# Patient Record
Sex: Male | Born: 1955 | Race: Black or African American | Hispanic: No | Marital: Single | Smoking: Former smoker
Health system: Southern US, Community
[De-identification: ages and names within clinical notes are randomized; demographics above are authoritative.]

## PROBLEM LIST (undated history)

## (undated) DIAGNOSIS — I10 Essential (primary) hypertension: Secondary | ICD-10-CM

## (undated) DIAGNOSIS — G40909 Epilepsy, unspecified, not intractable, without status epilepticus: Secondary | ICD-10-CM

## (undated) DIAGNOSIS — C61 Malignant neoplasm of prostate: Secondary | ICD-10-CM

## (undated) DIAGNOSIS — M25569 Pain in unspecified knee: Secondary | ICD-10-CM

## (undated) HISTORY — DX: Pain in unspecified knee: M25.569

## (undated) HISTORY — DX: Epilepsy, unspecified, not intractable, without status epilepticus: G40.909

---

## 2017-08-09 ENCOUNTER — Emergency Department: Payer: BC Managed Care – PPO

## 2017-08-09 ENCOUNTER — Emergency Department
Admission: EM | Admit: 2017-08-09 | Discharge: 2017-08-09 | Disposition: A | Discharge status: Home / Self Care | Payer: BC Managed Care – PPO | Attending: Emergency Medicine | Admitting: Emergency Medicine

## 2017-08-09 ENCOUNTER — Encounter: Payer: Self-pay | Admitting: Intensive Care

## 2017-08-09 DIAGNOSIS — Z87891 Personal history of nicotine dependence: Secondary | ICD-10-CM | POA: Diagnosis not present

## 2017-08-09 DIAGNOSIS — I10 Essential (primary) hypertension: Secondary | ICD-10-CM | POA: Insufficient documentation

## 2017-08-09 DIAGNOSIS — Z8546 Personal history of malignant neoplasm of prostate: Secondary | ICD-10-CM | POA: Insufficient documentation

## 2017-08-09 DIAGNOSIS — R0789 Other chest pain: Secondary | ICD-10-CM | POA: Diagnosis present

## 2017-08-09 DIAGNOSIS — R079 Chest pain, unspecified: Secondary | ICD-10-CM

## 2017-08-09 HISTORY — DX: Essential (primary) hypertension: I10

## 2017-08-09 HISTORY — DX: Epilepsy, unspecified, not intractable, without status epilepticus: G40.909

## 2017-08-09 HISTORY — DX: Malignant neoplasm of prostate: C61

## 2017-08-09 LAB — BASIC METABOLIC PANEL
Anion gap: 8 (ref 5–15)
BUN: 11 mg/dL (ref 6–20)
CO2: 24 mmol/L (ref 22–32)
Calcium: 8.7 mg/dL — ABNORMAL LOW (ref 8.9–10.3)
Chloride: 104 mmol/L (ref 101–111)
Creatinine, Ser: 0.84 mg/dL (ref 0.61–1.24)
GFR calc Af Amer: >60 mL/min (ref >60)
GFR calc non Af Amer: >60 mL/min (ref >60)
Glucose, Bld: 136 mg/dL — ABNORMAL HIGH (ref 65–99)
Potassium: 3.4 mmol/L — ABNORMAL LOW (ref 3.5–5.1)
Sodium: 136 mmol/L (ref 135–145)

## 2017-08-09 LAB — CBC
HCT: 41.5 % (ref 40.0–52.0)
Hemoglobin: 13.9 g/dL (ref 13.0–18.0)
MCH: 30.2 pg (ref 26.0–34.0)
MCHC: 33.5 g/dL (ref 32.0–36.0)
MCV: 90.1 fL (ref 80.0–100.0)
Platelets: 171 10*3/uL (ref 150–440)
RBC: 4.6 MIL/uL (ref 4.40–5.90)
RDW: 12.8 % (ref 11.5–14.5)
WBC: 5.8 10*3/uL (ref 3.8–10.6)

## 2017-08-09 LAB — TROPONIN I
Troponin I: <0.03 ng/mL (ref <0.03)
Troponin I: <0.03 ng/mL (ref <0.03)

## 2017-08-09 MED ORDER — IOPAMIDOL (ISOVUE-370) INJECTION 76%
75.0000 mL | Freq: Once | INTRAVENOUS | Status: AC | PRN
  Administered 2017-08-09: 75 mL via INTRAVENOUS
  Filled 2017-08-09: qty 75

## 2017-08-09 NOTE — Discharge Instructions (Addendum)
Fortunately today your blood work, your chest x-ray, your EKG, and your CT scan were reassuring.  Please make an appointment to follow-up with the cardiologist in 2 days for reevaluation return to the emergency department sooner for any concerns whatsoever.  It was a pleasure to take care of you today, and thank you for coming to our emergency department.  If you have any questions or concerns before leaving please ask the nurse to grab me and I'm more than happy to go through your aftercare instructions again.  If you were prescribed any opioid pain medication today such as Norco, Vicodin, Percocet, morphine, hydrocodone, or oxycodone please make sure you do not drive when you are taking this medication as it can alter your ability to drive safely.  If you have any concerns once you are home that you are not improving or are in fact getting worse before you can make it to your follow-up appointment, please do not hesitate to call 911 and come back for further evaluation.  Darel Hong, MD  Results for orders placed or performed during the hospital encounter of 95/28/41  Basic metabolic panel  Result Value Ref Range   Sodium 136 135 - 145 mmol/L   Potassium 3.4 (L) 3.5 - 5.1 mmol/L   Chloride 104 101 - 111 mmol/L   CO2 24 22 - 32 mmol/L   Glucose, Bld 136 (H) 65 - 99 mg/dL   BUN 11 6 - 20 mg/dL   Creatinine, Ser 0.84 0.61 - 1.24 mg/dL   Calcium 8.7 (L) 8.9 - 10.3 mg/dL   GFR calc non Af Amer >60 >60 mL/min   GFR calc Af Amer >60 >60 mL/min   Anion gap 8 5 - 15  CBC  Result Value Ref Range   WBC 5.8 3.8 - 10.6 K/uL   RBC 4.60 4.40 - 5.90 MIL/uL   Hemoglobin 13.9 13.0 - 18.0 g/dL   HCT 41.5 40.0 - 52.0 %   MCV 90.1 80.0 - 100.0 fL   MCH 30.2 26.0 - 34.0 pg   MCHC 33.5 32.0 - 36.0 g/dL   RDW 12.8 11.5 - 14.5 %   Platelets 171 150 - 440 K/uL  Troponin I  Result Value Ref Range   Troponin I <0.03 <0.03 ng/mL  Troponin I  Result Value Ref Range   Troponin I <0.03 <0.03 ng/mL    Dg Chest 2 View  Result Date: 08/09/2017 CLINICAL DATA:  Chest pain for 1 week EXAM: CHEST  2 VIEW COMPARISON:  None. FINDINGS: Cardiac shadow is within normal limits. The lungs are clear bilaterally. A E rounded density is noted over the anterior aspect of the right second rib not well appreciated on the lateral projection and may be related to small bone island. Rib series may be helpful for further evaluation. No other focal abnormality is noted. IMPRESSION: Questionable nodular density over the anterior aspect of the right second rib. Rib series on the right may be helpful for further evaluation. Electronically Signed   By: Inez Catalina M.D.   On: 08/09/2017 12:56   Ct Angio Chest Pe W/cm &/or Wo Cm  Result Date: 08/09/2017 CLINICAL DATA:  Chest pain over the last week. Swelling of the lower extremities. EXAM: CT ANGIOGRAPHY CHEST WITH CONTRAST TECHNIQUE: Multidetector CT imaging of the chest was performed using the standard protocol during bolus administration of intravenous contrast. Multiplanar CT image reconstructions and MIPs were obtained to evaluate the vascular anatomy. CONTRAST:  84mL ISOVUE-370 IOPAMIDOL (ISOVUE-370) INJECTION 76%  COMPARISON:  Chest radiography same day. FINDINGS: Cardiovascular: Pulmonary arterial opacification is excellent. There are no pulmonary emboli. Mild aortic atherosclerosis. No definite coronary artery calcification. Heart size is normal. No pericardial fluid. Mediastinum/Nodes: No mass or lymphadenopathy. 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 Negative Hounsfield unit density also consistent with chronic mucous. No other pulmonary parenchymal finding. No evidence of emphysema. No infiltrate or collapse. No effusions. Upper Abdomen: No acute finding. 4 mm stone midportion of the right kidney. Musculoskeletal:  Old compression fracture of T6. No acute bone finding. Review of the MIP images confirms the above findings. IMPRESSION: No pulmonary emboli or other acute chest pathology. 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 Is there any study anywhere else for comparison? If not, I would suggest a follow-up study in 6 months to show stability. Very low concern for malignancy given this constellation of findings. Electronically Signed   By: Nelson Chimes M.D.   On: 08/09/2017 16:40

## 2017-08-09 NOTE — ED Notes (Signed)
Pt returned from CT °

## 2017-08-09 NOTE — ED Provider Notes (Signed)
[facility] Emergency Department Provider Note  ____________________________________________   First MD Initiated Contact with Patient 08/09/17 1543     (approximate)  I have reviewed the triage vital signs and the nursing notes.   HISTORY  Chief Complaint Chest Pain   HPI Shawn Carr is a 62 y.o. male who comes to the emergency department with 1 week of intermittent atypical chest pain.  Pain is sharp nonexertional diffuse across his upper chest.  Nonradiating.  Not ripping or tearing and does not go straight to his back.  He is also concerned because he is felt like his right lower extremity has been swollen for the past week or so.  He has no history of DVT or pulmonary embolism.  He has no history of coronary artery disease.  He has had no recent illness.  His pain is currently controlled.  It is non-positional when it occurs.  Nothing in particular seems to make the symptoms better or worse.  Past Medical History:  Diagnosis Date  . Epilepsy ([place])   . Hypertension   . Prostate cancer ([place])     There are no active problems to display for this patient.   History reviewed. No pertinent surgical history.  Prior to Admission medications   Not on File    Allergies Morphine and related  History reviewed. No pertinent family history.  Social History Social History   Tobacco Use  . Smoking status: Former Smoker    Types: Cigarettes    Last attempt to quit: 08/06/2014    Years since quitting: 3.0  . Smokeless tobacco: Never Used  Substance Use Topics  . Alcohol use: No    Frequency: Never  . Drug use: No    Review of Systems Constitutional: No fever/chills Eyes: No visual changes. ENT: No sore throat. Cardiovascular: Positive for chest pain. Respiratory: Denies shortness of breath. Gastrointestinal: No abdominal pain.  No nausea, no vomiting.  No diarrhea.  No constipation. Genitourinary: Negative for dysuria. Musculoskeletal:  Negative for back pain. Skin: Negative for rash. Neurological: Negative for headaches, focal weakness or numbness.   ____________________________________________   PHYSICAL EXAM:  VITAL SIGNS: ED Triage Vitals [08/09/17 1217]  Enc Vitals Group     BP (!) 155/82     Pulse Rate 89     Resp 14     Temp 99.5 F (37.5 C)     Temp Source Oral     SpO2 98 %     Weight 211 lb (95.7 kg)     Height 5\' 9"  (1.753 m)     Head Circumference      Peak Flow      Pain Score 1     Pain Loc      Pain Edu?      Excl. in [place]?     Constitutional: Alert and oriented x4 well-appearing nontoxic no diaphoresis speaks in full clear sentences Eyes: PERRL EOMI. Head: Atraumatic. Nose: No congestion/rhinnorhea. Mouth/Throat: No trismus Neck: No stridor.  Able to lie completely flat no JVD Cardiovascular: Normal rate, regular rhythm. Grossly normal heart sounds.  Good peripheral circulation. Respiratory: Normal respiratory effort.  No retractions. Lungs CTAB and moving good air Gastrointestinal: Soft nontender Musculoskeletal: 1+ pitting edema to bilateral lower extremities to mid shin.  Legs are equal in size Neurologic:  Normal speech and language. No gross focal neurologic deficits are appreciated. Skin:  Skin is warm, dry and intact. No rash noted. Psychiatric: Mood and affect are normal. Speech  and behavior are normal.    ____________________________________________   DIFFERENTIAL includes but not limited to  Acute coronary syndrome, pulmonary embolism, aortic dissection, musculoskeletal pain, influenza ____________________________________________   LABS (all labs ordered are listed, but only abnormal results are displayed)  Labs Reviewed  BASIC METABOLIC PANEL - Abnormal; Notable for the following components:      Result Value   Potassium 3.4 (*)    Glucose, Bld 136 (*)    Calcium 8.7 (*)    All other components within normal limits  CBC  TROPONIN I  TROPONIN I    Lab work  reviewed by me with no signs of acute ischemia x2 __________________________________________  EKG  ED ECG REPORT I, Darel Hong, the attending physician, personally viewed and interpreted this ECG.  Date: 08/09/2017 EKG Time:  Rate: 85 Rhythm: normal sinus rhythm QRS Axis: normal Intervals: First-degree AV block ST/T Wave abnormalities: normal Narrative Interpretation: no evidence of acute ischemia  ____________________________________________  RADIOLOGY  Chest x-ray reviewed by me with possible nodular density over the right second rib ____________________________________________   PROCEDURES  Procedure(s) performed: no  Procedures  Critical Care performed: no  Observation: no ____________________________________________   INITIAL IMPRESSION / ASSESSMENT AND PLAN / ED COURSE  Pertinent labs & imaging results that were available during my care of the patient were reviewed by me and considered in my medical decision making (see chart for details).  The patient's pain is atypical.  EKG reassuring chest x-ray with no evidence of acute disease and 2 troponins negative.  Strict return precautions have been given and I will refer him to cardiology as an outpatient.  Patient verbalizes understanding and agreement the plan.      ____________________________________________   FINAL CLINICAL IMPRESSION(S) / ED DIAGNOSES  Final diagnoses:  Nonspecific chest pain      NEW MEDICATIONS STARTED DURING THIS VISIT:  There are no discharge medications for this patient.    Note:  This document was prepared using Dragon voice recognition software and may include unintentional dictation errors.     Darel Hong, MD 08/11/17 1445

## 2017-08-09 NOTE — ED Triage Notes (Addendum)
Patient reports tugging chest pains X1 week. No radiation. A&O x4 Patient also c/o R leg pain/swelling X 1/2 weeks.

## 2017-09-13 ENCOUNTER — Other Ambulatory Visit: Payer: Self-pay

## 2017-09-13 ENCOUNTER — Encounter: Payer: Self-pay | Admitting: Emergency Medicine

## 2017-09-13 ENCOUNTER — Emergency Department: Payer: BC Managed Care – PPO

## 2017-09-13 ENCOUNTER — Emergency Department
Admission: EM | Admit: 2017-09-13 | Discharge: 2017-09-13 | Disposition: A | Discharge status: Home / Self Care | Payer: BC Managed Care – PPO | Attending: Emergency Medicine | Admitting: Emergency Medicine

## 2017-09-13 DIAGNOSIS — Z87891 Personal history of nicotine dependence: Secondary | ICD-10-CM | POA: Insufficient documentation

## 2017-09-13 DIAGNOSIS — I10 Essential (primary) hypertension: Secondary | ICD-10-CM | POA: Insufficient documentation

## 2017-09-13 DIAGNOSIS — Z8546 Personal history of malignant neoplasm of prostate: Secondary | ICD-10-CM | POA: Insufficient documentation

## 2017-09-13 DIAGNOSIS — R079 Chest pain, unspecified: Secondary | ICD-10-CM | POA: Insufficient documentation

## 2017-09-13 DIAGNOSIS — M7121 Synovial cyst of popliteal space [Baker], right knee: Secondary | ICD-10-CM | POA: Insufficient documentation

## 2017-09-13 DIAGNOSIS — R2241 Localized swelling, mass and lump, right lower limb: Secondary | ICD-10-CM | POA: Diagnosis present

## 2017-09-13 LAB — CBC
HEMATOCRIT: 41.9 % (ref 40.0–52.0)
Hemoglobin: 14.2 g/dL (ref 13.0–18.0)
MCH: 30.4 pg (ref 26.0–34.0)
MCHC: 33.8 g/dL (ref 32.0–36.0)
MCV: 90 fL (ref 80.0–100.0)
PLATELETS: 222 10*3/uL (ref 150–440)
RBC: 4.66 MIL/uL (ref 4.40–5.90)
RDW: 12.8 % (ref 11.5–14.5)
WBC: 6.9 10*3/uL (ref 3.8–10.6)

## 2017-09-13 LAB — BASIC METABOLIC PANEL
Anion gap: 11 (ref 5–15)
BUN: 10 mg/dL (ref 6–20)
CALCIUM: 8.9 mg/dL (ref 8.9–10.3)
CO2: 24 mmol/L (ref 22–32)
Chloride: 101 mmol/L (ref 101–111)
Creatinine, Ser: 0.69 mg/dL (ref 0.61–1.24)
GLUCOSE: 97 mg/dL (ref 65–99)
POTASSIUM: 3.5 mmol/L (ref 3.5–5.1)
Sodium: 136 mmol/L (ref 135–145)

## 2017-09-13 LAB — TROPONIN I: Troponin I: <0.03 ng/mL (ref <0.03)

## 2017-09-13 MED ORDER — CEPHALEXIN 500 MG PO CAPS: 500.0000 mg | ORAL_CAPSULE | Freq: Two times a day (BID) | ORAL | 0 refills | Status: AC

## 2017-09-13 MED ORDER — ENALAPRIL MALEATE 10 MG PO TABS
ORAL_TABLET | ORAL | Status: AC
  Administered 2017-09-13: 10 mg via ORAL
  Filled 2017-09-13: qty 1

## 2017-09-13 MED ORDER — TRAMADOL HCL 50 MG PO TABS: 50.0000 mg | ORAL_TABLET | Freq: Four times a day (QID) | ORAL | 0 refills | Status: AC | PRN

## 2017-09-13 MED ORDER — ENALAPRIL MALEATE 10 MG PO TABS: 10.0000 mg | ORAL_TABLET | Freq: Every day | ORAL | 1 refills | Status: AC

## 2017-09-13 MED ORDER — ENALAPRIL MALEATE 10 MG PO TABS
10.0000 mg | ORAL_TABLET | Freq: Once | ORAL | Status: AC
  Administered 2017-09-13: 10 mg via ORAL

## 2017-09-13 NOTE — ED Notes (Signed)
Attempt for blood by this RN unsuccessful.

## 2017-09-13 NOTE — ED Notes (Signed)
EDP aware of delay in bloodwork.

## 2017-09-13 NOTE — ED Provider Notes (Signed)
[facility] Emergency Department Provider Note  Time seen: 11:36 AM  I have reviewed the triage vital signs and the nursing notes.   HISTORY  Chief Complaint No chief complaint on file.    HPI Shawn Carr is a 62 y.o. male with a past medical history of hypertension, epilepsy, presents to the emergency department for right leg pain and swelling.  According to the patient for the past 1 month he has been having pain and intermittent swelling of the right leg.  States the pain got worse over the past 2-3 days so he went to urgent care today for evaluation and they referred to the emergency department.  Patient denies any cough, shortness of breath, fever, does state intermittent chest discomfort over the last several months, but states he was evaluated for this approximately 1 month ago with a CT scan of the chest that was negative.  Denies any known fever.  Denies nausea or vomiting.  Largely negative review of systems otherwise.  As a side note the patient does state he needs a refill of his enalapril.  Past Medical History:  Diagnosis Date  . Epilepsy ([place])   . Hypertension   . Prostate cancer ([place])     There are no active problems to display for this patient.   History reviewed. No pertinent surgical history.  Prior to Admission medications   Not on File    Allergies  Allergen Reactions  . Morphine And Related Hives    No family history on file.  Social History Social History   Tobacco Use  . Smoking status: Former Smoker    Types: Cigarettes    Last attempt to quit: 08/06/2014    Years since quitting: 3.1  . Smokeless tobacco: Never Used  Substance Use Topics  . Alcohol use: No    Frequency: Never  . Drug use: No    Review of Systems Constitutional: Negative for fever. Eyes: Negative for visual complaints ENT: Negative for recent illness/congestion Cardiovascular: intermittent chest pain times several months per  patient. Respiratory: Negative for shortness of breath. Gastrointestinal: Negative for abdominal pain Genitourinary: Negative for urinary compaints Musculoskeletal: Right leg pain and occasional swelling Skin: Negative for skin complaints  Neurological: Negative for headache All other ROS negative  ____________________________________________   PHYSICAL EXAM:  VITAL SIGNS: ED Triage Vitals  Enc Vitals Group     BP 09/13/17 1118 (!) 187/95     Pulse Rate 09/13/17 1118 74     Resp 09/13/17 1118 16     Temp 09/13/17 1118 98.1 F (36.7 C)     Temp Source 09/13/17 1118 Oral     SpO2 09/13/17 1118 96 %     Weight 09/13/17 1117 218 lb (98.9 kg)     Height 09/13/17 1117 5\' 9"  (1.753 m)     Head Circumference --      Peak Flow --      Pain Score 09/13/17 1116 10     Pain Loc --      Pain Edu? --      Excl. in [place]? --    Constitutional: Alert and oriented. Well appearing and in no distress. Eyes: Normal exam ENT   Head: Normocephalic and atraumatic.   Mouth/Throat: Mucous membranes are moist. Cardiovascular: Normal rate, regular rhythm. No murmur Respiratory: Normal respiratory effort without tachypnea nor retractions. Breath sounds are clear  Gastrointestinal: Soft and nontender. No distention. Musculoskeletal: No noted edema in the right lower extremity right lower extremity  does feel slightly warm compared to the left with moderate calf tenderness.  Neurovascular intact distally. Neurologic:  Normal speech and language. No gross focal neurologic deficits  Skin:  Skin is warm, dry and intact.  Psychiatric: Mood and affect are normal.   ____________________________________________    EKG  EKG reviewed and interpreted by myself shows normal sinus rhythm at 74 bpm with a narrow QRS, normal axis, normal intervals besides slight PR prolongation consistent with first-degree AV block, nonspecific ST changes without ST  elevation.  ____________________________________________    RADIOLOGY  Ultrasound negative for DVT.  However shows a possible leaking Baker's cyst.  ____________________________________________   INITIAL IMPRESSION / ASSESSMENT AND PLAN / ED COURSE  Pertinent labs & imaging results that were available during my care of the patient were reviewed by me and considered in my medical decision making (see chart for details).  Vision presents to the emergency department for right leg pain.  Also states intermittent chest pain over the past several months.  Patient had a workup performed approximately 1 month ago with a CT angiography of the chest and labs which are normal.  We will repeat basic labs including cardiac enzymes and an EKG.  We will obtain an ultrasound of the leg.  Differential would include cellulitis versus DVT.  Patient agreeable to this plan of care.  Patient is ultrasound most consistent with leaking Baker's cyst which would explain his symptoms as well.  We will cover with antibiotics as a precaution have the patient follow-up with orthopedics for further evaluation.  Ultrasound negative for DVT, EKG shows nonspecific findings, awaiting lab results.  Labs are within normal limits including negative troponin.  Patient will be discharged home with Keflex and orthopedic follow-up.  Patient agreeable to plan of care.  ____________________________________________   FINAL CLINICAL IMPRESSION(S) / ED DIAGNOSES  Right leg pain Bakers cyst   Harvest Dark, MD 09/13/17 1338

## 2017-09-13 NOTE — ED Notes (Signed)
Gave the patient a urinal

## 2017-09-13 NOTE — ED Notes (Signed)
Patient transported to Ultrasound 

## 2017-09-13 NOTE — ED Notes (Signed)
Pt revealed to EDP that he has been experiencing chest pain over last month. Denies at this time. Orders placed by EDP.

## 2017-09-13 NOTE — ED Triage Notes (Addendum)
Seen through ED about 1 month ago for c/o right leg pain.  Seen through urgent care this morning for same complaint, and sent to ED for evaluation.  Patient states pain has been ongoing for same complaint.  States pain has worsened.  States unable to walk due to pain.  C/O pain to back of right leg.  Patient states he will also need a new RX for his blood pressure medicines today.

## 2017-09-13 NOTE — ED Notes (Signed)
Swelling to right lower extremity. Redness and warmth to right knee. EDP at bedside.

## 2017-09-13 NOTE — ED Notes (Signed)
Pt stuck by Lanny Hurst, medic unsuccessful. Charge RN informed. Lab called for assistance.

## 2017-09-17 ENCOUNTER — Ambulatory Visit: Payer: BC Managed Care – PPO | Admitting: Cardiovascular Disease

## 2017-10-17 ENCOUNTER — Ambulatory Visit
Admission: EM | Admit: 2017-10-17 | Discharge: 2017-10-17 | Disposition: A | Discharge status: Home / Self Care | Payer: BC Managed Care – PPO | Attending: Family Medicine | Admitting: Family Medicine

## 2017-10-17 ENCOUNTER — Other Ambulatory Visit: Payer: Self-pay

## 2017-10-17 ENCOUNTER — Encounter: Payer: Self-pay | Admitting: Gynecology

## 2017-10-17 DIAGNOSIS — I10 Essential (primary) hypertension: Secondary | ICD-10-CM | POA: Diagnosis not present

## 2017-10-17 DIAGNOSIS — G40802 Other epilepsy, not intractable, without status epilepticus: Secondary | ICD-10-CM | POA: Diagnosis not present

## 2017-10-17 MED ORDER — PHENYTOIN SODIUM EXTENDED 100 MG PO CAPS: 100.0000 mg | ORAL_CAPSULE | Freq: Three times a day (TID) | ORAL | 0 refills | Status: DC

## 2017-10-17 NOTE — ED Provider Notes (Signed)
[facility]    CSN: [id number] Arrival date & time: 10/17/17  1157     History   Chief Complaint Chief Complaint  Patient presents with  . Medication Refill    HPI TITO AUSMUS is a 62 y.o. male.   62 yo male with a h/o epilepsy here with a c/o refill request on his phenytoin. States he moved here from [place] and has not established care with a PCP but just ran out of his medication.   The history is provided by the patient.    Past Medical History:  Diagnosis Date  . Epilepsy ([place])   . Hypertension   . Prostate cancer ([place])     There are no active problems to display for this patient.   History reviewed. No pertinent surgical history.     Home Medications    Prior to Admission medications   Medication Sig Start Date End Date Taking? Authorizing Provider  enalapril (VASOTEC) 10 MG tablet Take 1 tablet (10 mg total) by mouth daily. 09/13/17 09/13/18  Harvest Dark, MD  phenytoin (DILANTIN) 100 MG ER capsule Take 1 capsule (100 mg total) by mouth 3 (three) times daily. 10/17/17   Norval Gable, MD  traMADol (ULTRAM) 50 MG tablet Take 1 tablet (50 mg total) by mouth every 6 (six) hours as needed. 09/13/17   Harvest Dark, MD    Family History Family History  Problem Relation Age of Onset  . Cancer Mother        ovarian  . Stroke Father     Social History Social History   Tobacco Use  . Smoking status: Former Smoker    Types: Cigarettes    Last attempt to quit: 08/06/2014    Years since quitting: 3.2  . Smokeless tobacco: Never Used  Substance Use Topics  . Alcohol use: No    Frequency: Never  . Drug use: No     Allergies   Morphine and related   Review of Systems Review of Systems   Physical Exam Triage Vital Signs ED Triage Vitals  Enc Vitals Group     BP 10/17/17 1208 (!) 170/93     Pulse Rate 10/17/17 1208 97     Resp 10/17/17 1208 16     Temp 10/17/17 1208 98.2 F (36.8 C)     Temp Source 10/17/17 1207  Oral     SpO2 10/17/17 1208 99 %     Weight 10/17/17 1209 218 lb (98.9 kg)     Height 10/17/17 1209 5\' 9"  (1.753 m)     Head Circumference --      Peak Flow --      Pain Score 10/17/17 1209 1     Pain Loc --      Pain Edu? --      Excl. in [place]? --    No data found.  Updated Vital Signs BP (!) 170/93 (BP Location: Left Arm)   Pulse 97   Temp 98.2 F (36.8 C)   Resp 16   Ht 5\' 9"  (1.753 m)   Wt 218 lb (98.9 kg)   SpO2 99%   BMI 32.19 kg/m   Visual Acuity Right Eye Distance:   Left Eye Distance:   Bilateral Distance:    Right Eye Near:   Left Eye Near:    Bilateral Near:     Physical Exam  Constitutional: He is oriented to person, place, and time. He appears well-developed and well-nourished. No distress.  Cardiovascular: Normal rate,  regular rhythm and normal heart sounds.  Pulmonary/Chest: Effort normal and breath sounds normal. No stridor. No respiratory distress. He has no wheezes. He has no rales.  Neurological: He is alert and oriented to person, place, and time.  Skin: He is not diaphoretic.  Nursing note and vitals reviewed.    UC Treatments / Results  Labs (all labs ordered are listed, but only abnormal results are displayed) Labs Reviewed - No data to display  EKG None Radiology No results found.  Procedures Procedures (including critical care time)  Medications Ordered in UC Medications - No data to display   Initial Impression / Assessment and Plan / UC Course  I have reviewed the triage vital signs and the nursing notes.  Pertinent labs & imaging results that were available during my care of the patient were reviewed by me and considered in my medical decision making (see chart for details).       Final Clinical Impressions(s) / UC Diagnoses   Final diagnoses:  Other epilepsy without status epilepticus, not intractable ([place])  Essential hypertension    ED Discharge Orders        Ordered    phenytoin (DILANTIN) 100 MG ER capsule  3  times daily     10/17/17 1229     1. diagnosis reviewed with patient 2. rx as per orders above; reviewed possible side effects, interactions, risks and benefits  3. Recommend establish care with a PCP 4. Follow-up prn if symptoms worsen or don't improve  Controlled Substance Prescriptions [place] Controlled Substance Registry consulted? Not Applicable   Norval Gable, MD 10/17/17 1245

## 2017-10-17 NOTE — ED Triage Notes (Signed)
Per patient would like to have refill on his on Phenytoin EX 100mg 

## 2017-10-27 ENCOUNTER — Ambulatory Visit: Payer: BC Managed Care – PPO | Admitting: Internal Medicine

## 2017-10-27 NOTE — Progress Notes (Deleted)
New Outpatient Visit Date: 10/27/2017  Referring Provider: No referring provider defined for this encounter.  Chief Complaint: ***  HPI:  Shawn Carr is a 62 y.o. male who is being seen today for the evaluation of chest pain after ED visit in February.  He has had 2 subsequent ED visits in March and April for right Baker's cyst and epilepsy. Marland Kitchen He has a history of hypertension, prostate cancer, and epilepsy.  He presented to the [facility] ED on 08/09/2017 with a 1 week history of intermittent chest pain across his upper chest. ***  --------------------------------------------------------------------------------------------------  Cardiovascular History & Procedures: Cardiovascular Problems:  ***  Risk Factors:  ***  Cath/PCI:  ***  CV Surgery:  ***  EP Procedures and Devices:  ***  Non-Invasive Evaluation(s):  Exercise MPI (11/29/2009): No evidence of ischemia or scar.  LVEF 59%.  Fair exercise capacity without significant EKG changes.  Recent CV Pertinent Labs: Lab Results  Component Value Date   K 3.5 09/13/2017   BUN 10 09/13/2017   CREATININE 0.69 09/13/2017    --------------------------------------------------------------------------------------------------  Past Medical History:  Diagnosis Date  . Epilepsy ([place])   . Hypertension   . Prostate cancer [facility])     Social History   Tobacco Use  . Smoking status: Former Smoker    Types: Cigarettes    Last attempt to quit: 08/06/2014    Years since quitting: 3.2  . Smokeless tobacco: Never Used  Substance Use Topics  . Alcohol use: No    Frequency: Never  . Drug use: No    No outpatient medications have been marked as taking for the 10/27/17 encounter (Appointment) with Olivine Hiers, Harrell Gave, MD.    Allergies: Morphine and related  Social History   Socioeconomic History  . Marital status: Single    Spouse name: Not on file  . Number of children: Not on file  . Years of education: Not on file  . Highest  education level: Not on file  Occupational History  . Not on file  Social Needs  . Financial resource strain: Not on file  . Food insecurity:    Worry: Not on file    Inability: Not on file  . Transportation needs:    Medical: Not on file    Non-medical: Not on file  Tobacco Use  . Smoking status: Former Smoker    Types: Cigarettes    Last attempt to quit: 08/06/2014    Years since quitting: 3.2  . Smokeless tobacco: Never Used  Substance and Sexual Activity  . Alcohol use: No    Frequency: Never  . Drug use: No  . Sexual activity: Not on file  Lifestyle  . Physical activity:    Days per week: Not on file    Minutes per session: Not on file  . Stress: Not on file  Relationships  . Social connections:    Talks on phone: Not on file    Gets together: Not on file    Attends religious service: Not on file    Active member of club or organization: Not on file    Attends meetings of clubs or organizations: Not on file    Relationship status: Not on file  . Intimate partner violence:    Fear of current or ex partner: Not on file    Emotionally abused: Not on file    Physically abused: Not on file    Forced sexual activity: Not on file  Other Topics Concern  . Not on file  Social History Narrative  . Not on file    Family History  Problem Relation Age of Onset  . Cancer Mother        ovarian  . Stroke Father     Review of Systems: A 12-system review of systems was performed and was negative except as noted in the HPI.  --------------------------------------------------------------------------------------------------  Physical Exam: There were no vitals taken for this visit.  General:  *** HEENT: No conjunctival pallor or scleral icterus. Moist mucous membranes. OP clear. Neck: Supple without lymphadenopathy, thyromegaly, JVD, or HJR. No carotid bruit. Lungs: Normal work of breathing. Clear to auscultation bilaterally without wheezes or crackles. Heart: Regular  rate and rhythm without murmurs, rubs, or gallops. Non-displaced PMI. Abd: Bowel sounds present. Soft, NT/ND without hepatosplenomegaly Ext: No lower extremity edema. Radial, PT, and DP pulses are 2+ bilaterally Skin: Warm and dry without rash. Neuro: CNIII-XII intact. Strength and fine-touch sensation intact in upper and lower extremities bilaterally. Psych: Normal mood and affect.  EKG:  ***  Lab Results  Component Value Date   WBC 6.9 09/13/2017   HGB 14.2 09/13/2017   HCT 41.9 09/13/2017   MCV 90.0 09/13/2017   PLT 222 09/13/2017    Lab Results  Component Value Date   NA 136 09/13/2017   K 3.5 09/13/2017   CL 101 09/13/2017   CO2 24 09/13/2017   BUN 10 09/13/2017   CREATININE 0.69 09/13/2017   GLUCOSE 97 09/13/2017    No results found for: CHOL, HDL, LDLCALC, LDLDIRECT, TRIG, CHOLHDL   --------------------------------------------------------------------------------------------------  ASSESSMENT AND PLAN: ***  Nelva Bush, MD 10/27/2017 2:53 PM

## 2017-10-28 ENCOUNTER — Encounter: Payer: Self-pay | Admitting: Internal Medicine

## 2017-10-30 ENCOUNTER — Ambulatory Visit
Admission: EM | Admit: 2017-10-30 | Discharge: 2017-10-30 | Disposition: A | Discharge status: Home / Self Care | Payer: BC Managed Care – PPO | Attending: Family Medicine | Admitting: Family Medicine

## 2017-10-30 ENCOUNTER — Other Ambulatory Visit: Payer: Self-pay

## 2017-10-30 DIAGNOSIS — Z760 Encounter for issue of repeat prescription: Secondary | ICD-10-CM | POA: Diagnosis not present

## 2017-10-30 MED ORDER — PHENYTOIN SODIUM EXTENDED 100 MG PO CAPS: 100.0000 mg | ORAL_CAPSULE | Freq: Three times a day (TID) | ORAL | 1 refills | Status: AC

## 2017-10-30 NOTE — ED Triage Notes (Signed)
Pt needs a refill of his Dilantin 100 mg TID. He has an appointment to establish care with a new PCP on 11/02/2017, but has run out of this medication yesterday.

## 2017-10-30 NOTE — ED Provider Notes (Signed)
[facility]  CSN: [id number] Arrival date & time: 10/30/17  1540  History   Chief Complaint Chief Complaint  Patient presents with  . Seizures   HPI   62 year old male presents for medication refill.  Patient has a seizure disorder.  He is stable on phenytoin.  He has an upcoming appointment to establish care on 4/30.  He is out of his medication and needs a refill.  He has had no recent seizure.  He has no other complaints or concerns this time.  Social History Social History   Tobacco Use  . Smoking status: Former Smoker    Types: Cigarettes    Last attempt to quit: 08/06/2014    Years since quitting: 3.2  . Smokeless tobacco: Never Used  Substance Use Topics  . Alcohol use: No    Frequency: Never  . Drug use: No    Allergies   Morphine and related  Review of Systems Review of Systems  Constitutional: Negative.   Neurological:       Seizure disorder.  No recent seizure.   Physical Exam Triage Vital Signs ED Triage Vitals  Enc Vitals Group     BP 10/30/17 1554 (!) 181/109     Pulse Rate 10/30/17 1554 80     Resp 10/30/17 1554 16     Temp 10/30/17 1554 98.4 F (36.9 C)     Temp Source 10/30/17 1554 Oral     SpO2 10/30/17 1554 98 %     Weight 10/30/17 1555 218 lb (98.9 kg)     Height 10/30/17 1555 5\' 7"  (1.702 m)     Head Circumference --      Peak Flow --      Pain Score 10/30/17 1558 0     Pain Loc --      Pain Edu? --      Excl. in [place]? --    Updated Vital Signs BP (!) 181/109 (BP Location: Left Arm)   Pulse 80   Temp 98.4 F (36.9 C) (Oral)   Resp 16   Ht 5\' 7"  (1.702 m)   Wt 218 lb (98.9 kg)   SpO2 98%   BMI 34.14 kg/m     Physical Exam  Constitutional: He is oriented to person, place, and time. He appears well-developed. No distress.  Cardiovascular: Normal rate and regular rhythm.  Pulmonary/Chest: Effort normal and breath sounds normal. He has no wheezes. He has no rales.  Neurological: He is alert and oriented to person,  place, and time.  Psychiatric: He has a normal mood and affect. His behavior is normal.  Nursing note and vitals reviewed.  UC Treatments / Results  Labs (all labs ordered are listed, but only abnormal results are displayed) Labs Reviewed - No data to display  EKG None Radiology No results found.  Procedures Procedures (including critical care time)  Medications Ordered in UC Medications - No data to display   Initial Impression / Assessment and Plan / UC Course  I have reviewed the triage vital signs and the nursing notes.  Pertinent labs & imaging results that were available during my care of the patient were reviewed by me and considered in my medical decision making (see chart for details).    62 year old male presents for medication refill.  Phenytoin refilled today.  17-month supply given.  Final Clinical Impressions(s) / UC Diagnoses   Final diagnoses:  Medication refill    ED Discharge Orders        Ordered  phenytoin (DILANTIN) 100 MG ER capsule  3 times daily     10/30/17 1600     Controlled Substance Prescriptions [place] Controlled Substance Registry consulted? Not Applicable   Coral Spikes, DO 10/30/17 1611

## 2017-11-02 ENCOUNTER — Ambulatory Visit: Payer: Self-pay | Admitting: Family Medicine

## 2018-10-27 IMAGING — CT CT ANGIO CHEST
2 of 6 series · 18 of 46 positions shown · IV contrast (APPLIED)
Comparison: Chest radiography same day.

CLINICAL DATA: Chest pain over the last week. Swelling of the lower
extremities.

EXAM:
CT ANGIOGRAPHY CHEST WITH CONTRAST
TECHNIQUE: Multidetector CT imaging of the chest was performed using the
standard protocol during bolus administration of intravenous
contrast. Multiplanar CT image reconstructions and MIPs were
obtained to evaluate the vascular anatomy.
CONTRAST:  75mL 7KYR2D-K15 IOPAMIDOL (7KYR2D-K15) INJECTION 76%

[Series 5: thins · axial · 0.77mm/px · z∈[-312,-60]mm · 15 of 277 slices shown]
[im 13/277  lung]
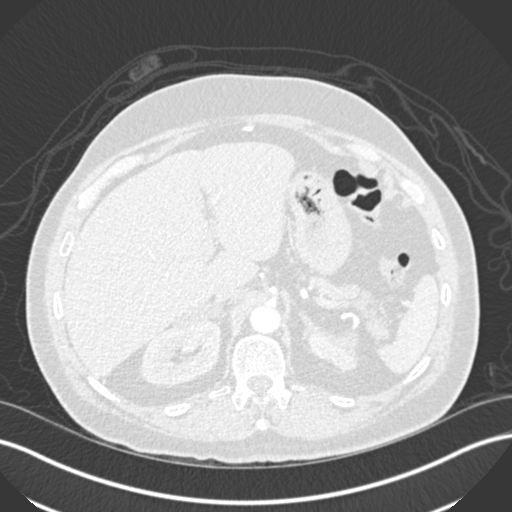
[im 37/277  soft-tissue]
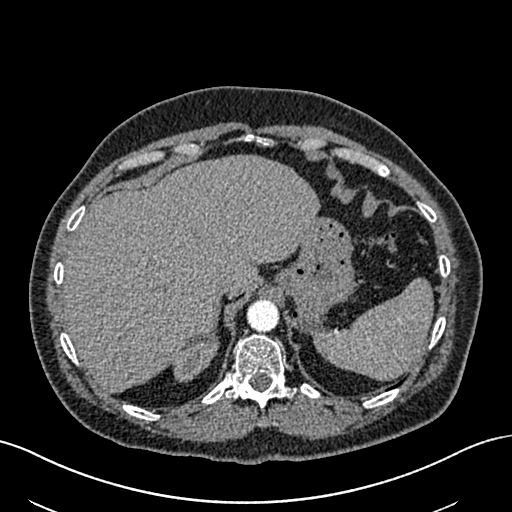
[im 49/277  lung]
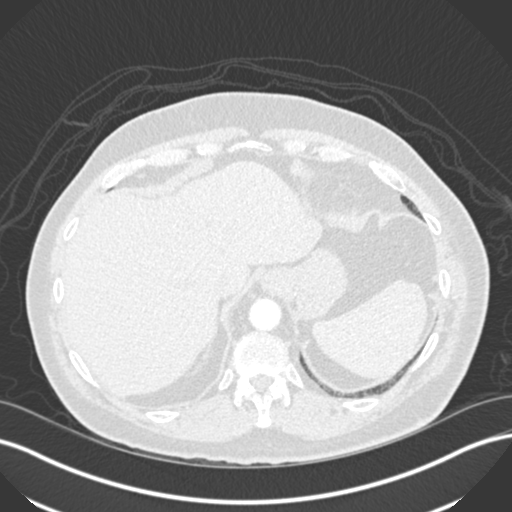
[im 73/277  soft-tissue]
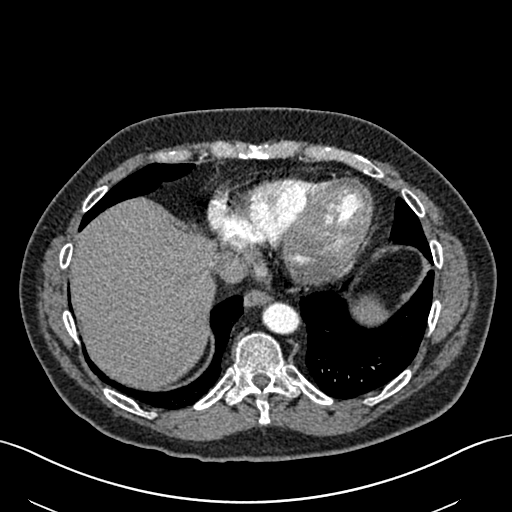
[im 85/277  lung]
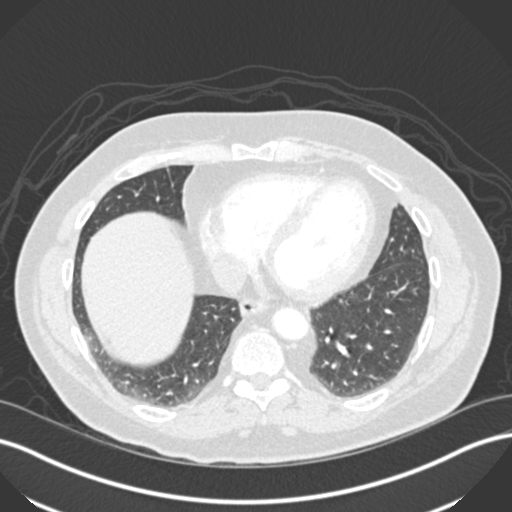
[im 109/277  soft-tissue]
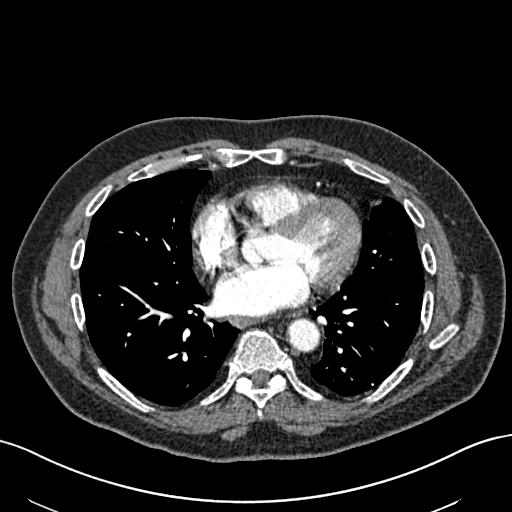
[im 121/277  lung]
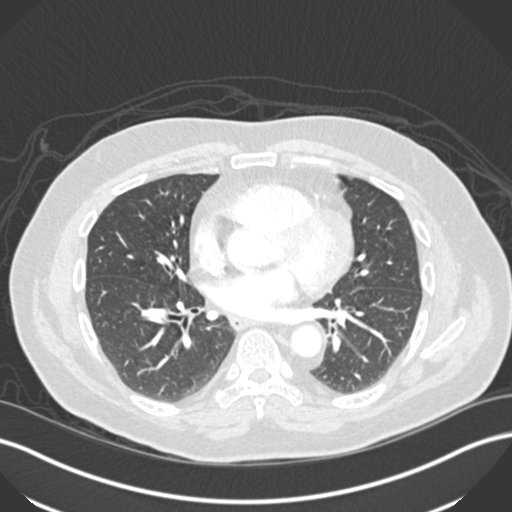
[im 145/277  soft-tissue]
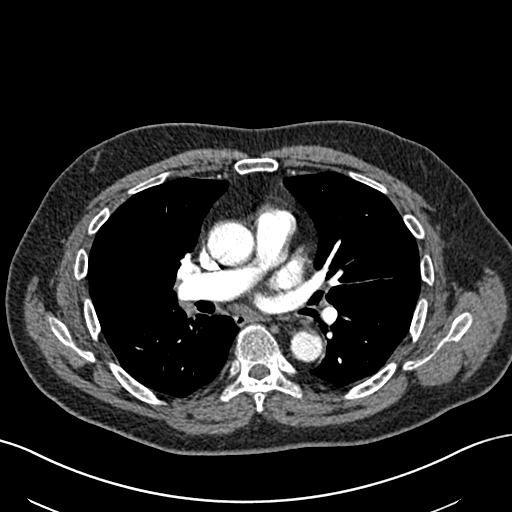
[im 157/277  lung]
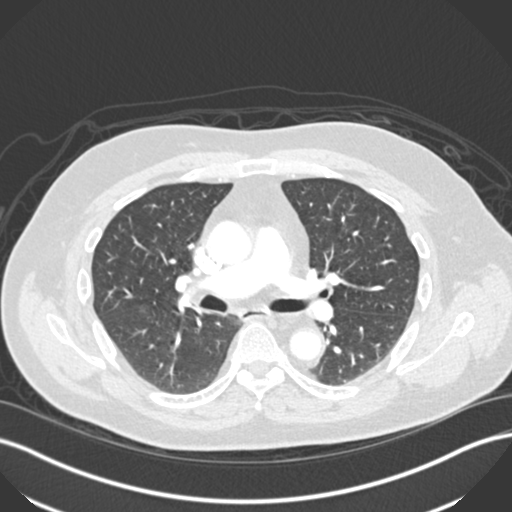
[im 169/277  soft-tissue]
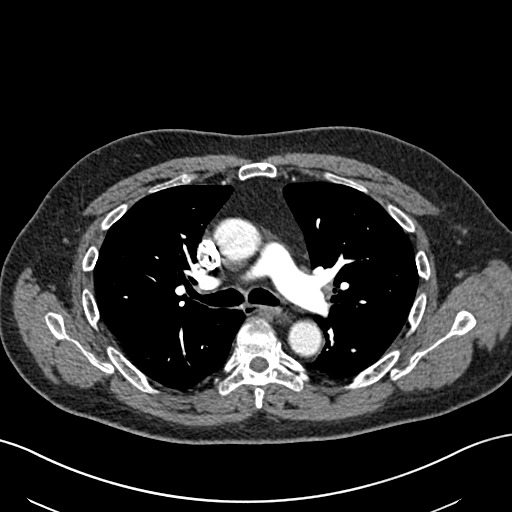
[im 193/277  lung]
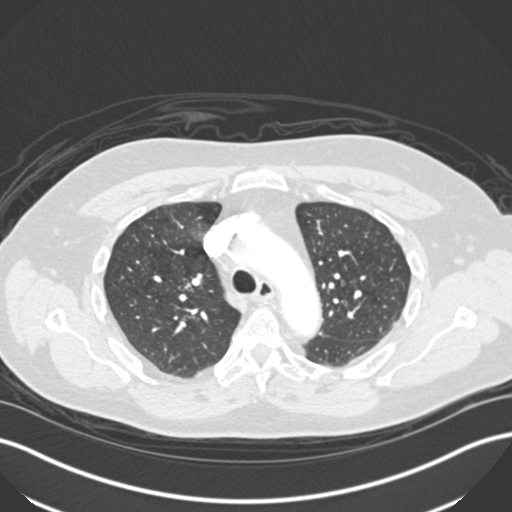
[im 205/277  soft-tissue]
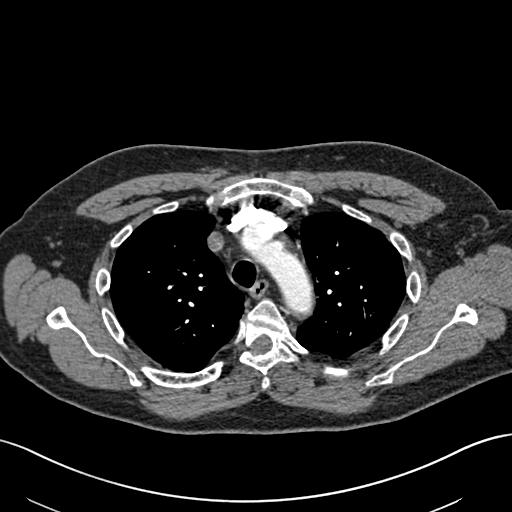
[im 229/277  lung]
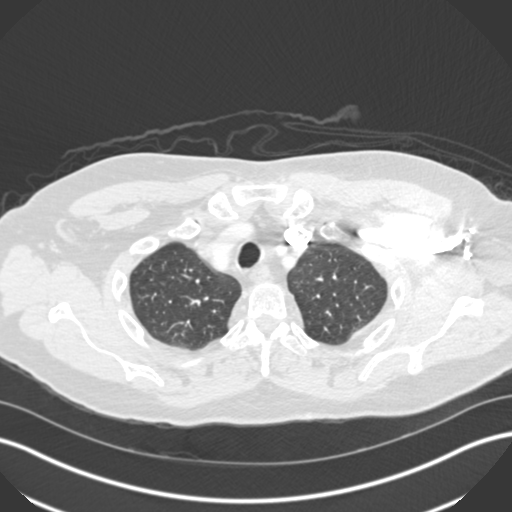
[im 241/277  soft-tissue]
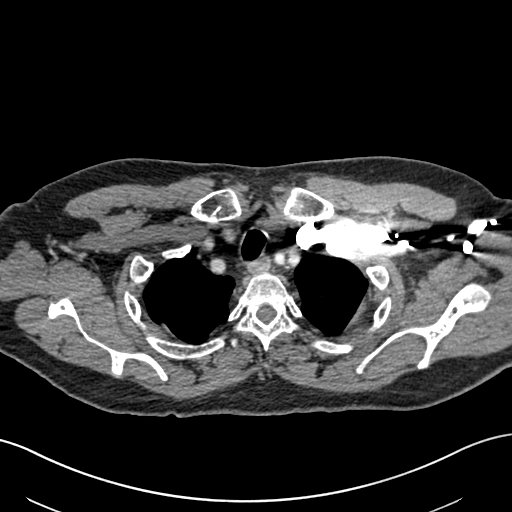
[im 265/277  lung]
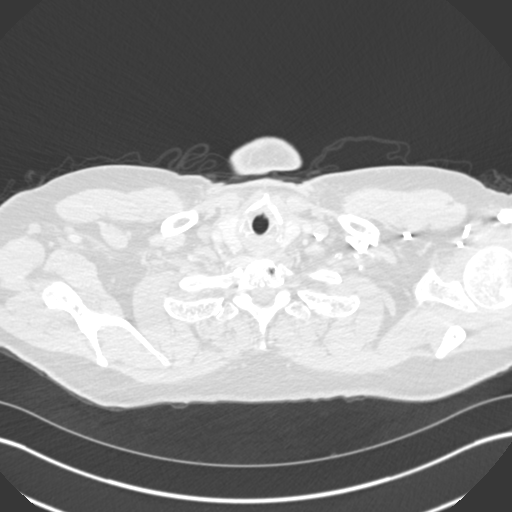

[Series 7: coronal mpr · coronal · 0.54mm/px · 3 of 89 slices shown]
[im 23/89  soft-tissue]
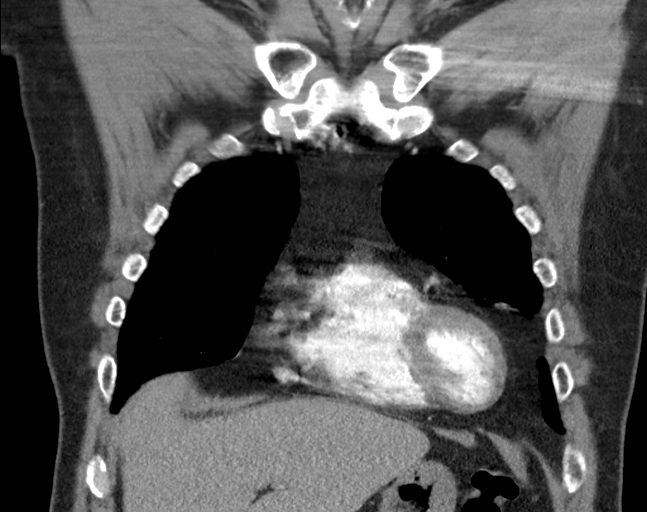
[im 45/89  soft-tissue]
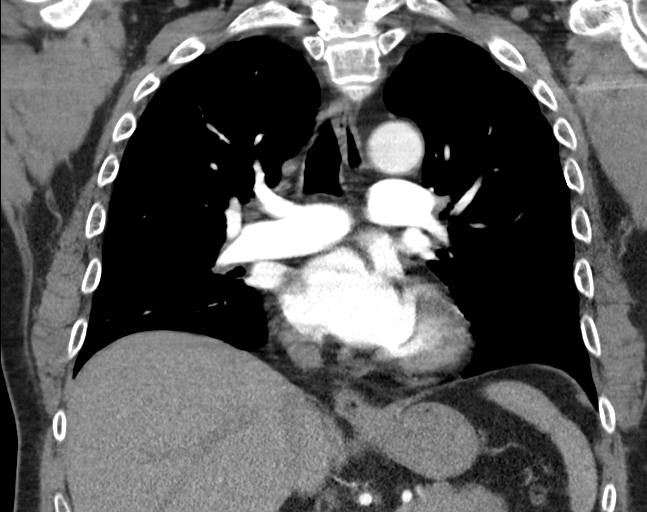
[im 67/89  soft-tissue]
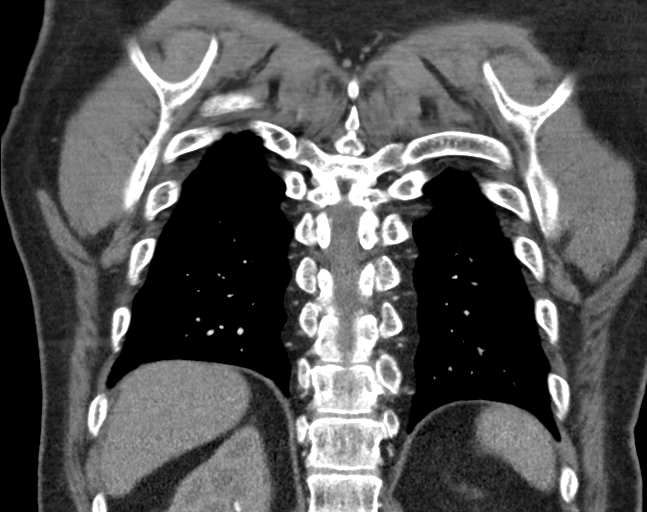

[18 of 46 positions shown; findings below may reference images not displayed]

FINDINGS: Cardiovascular: Pulmonary arterial opacification is excellent. There
are no pulmonary emboli. Mild aortic atherosclerosis. No definite
coronary artery calcification. Heart size is normal. No pericardial
fluid.

Mediastinum/Nodes: No mass or lymphadenopathy.

Lungs/Pleura: Very well-circumscribed rounded nodule in the right
upper lobe measuring 10 mm in diameter with surrounding parenchymal
lucency, strongly suggesting the diagnosis of bronchial atresia.
Negative Hounsfield unit density also consistent with chronic
mucous. No other pulmonary parenchymal finding. No evidence of
emphysema. No infiltrate or collapse. No effusions.

Upper Abdomen: No acute finding. 4 mm stone midportion of the right
kidney.

Musculoskeletal: Old compression fracture of T6. No acute bone
finding.

Review of the MIP images confirms the above findings.
IMPRESSION: No pulmonary emboli or other acute chest pathology.

The nodule seen at radiography is highly likely to represent mucoid
impaction secondary to bronchial atresia. Is there any study
anywhere else for comparison? If not, I would suggest a follow-up
study in 6 months to show stability. Very low concern for malignancy
given this constellation of findings.

## 2020-01-19 IMAGING — US US EXTREM LOW VENOUS*R*
1 series · 13 of 24 positions shown · non-contrast
Comparison: None.

CLINICAL DATA: Right lower extremity pain and edema for past month.
Evaluate for DVT.



[Series 1: us extrem low venous*right* · 0.08mm/px · 13 of 49 slices shown]
[im 1/49]
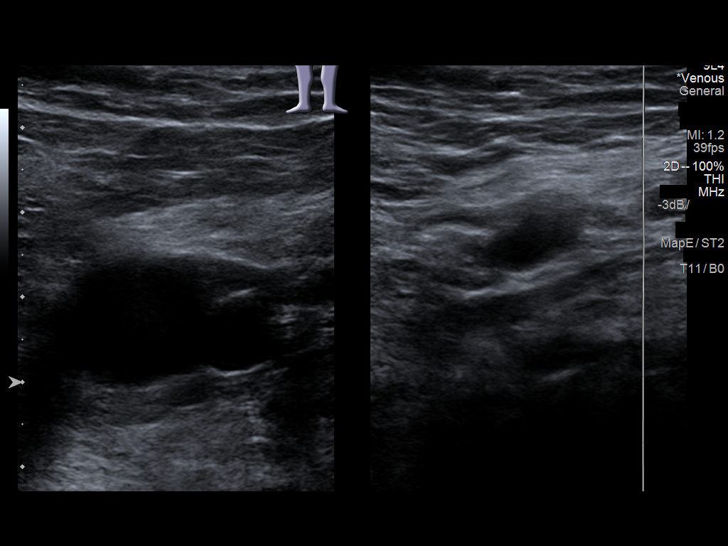
[im 5/49]
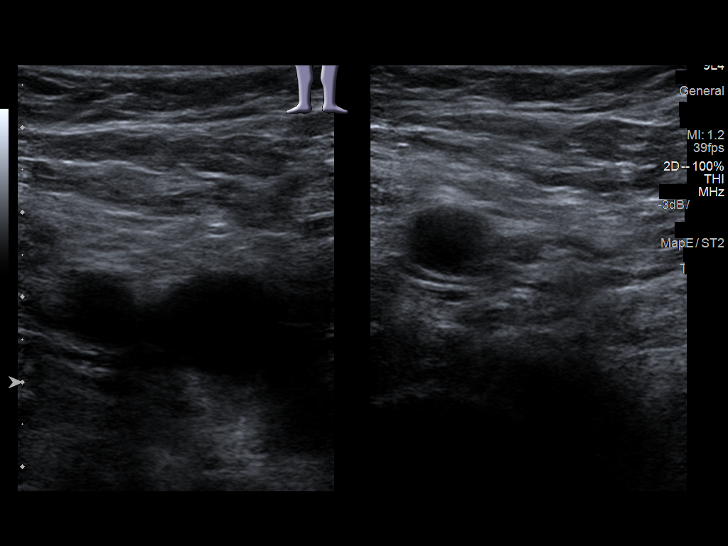
[im 9/49]
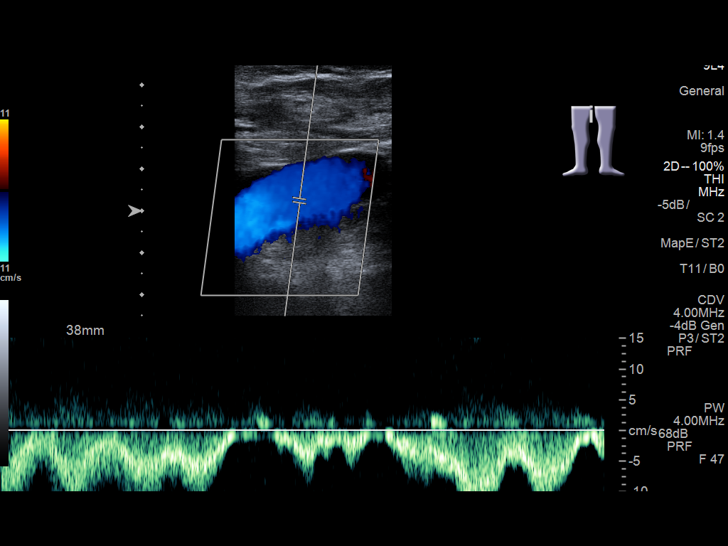
[im 13/49]
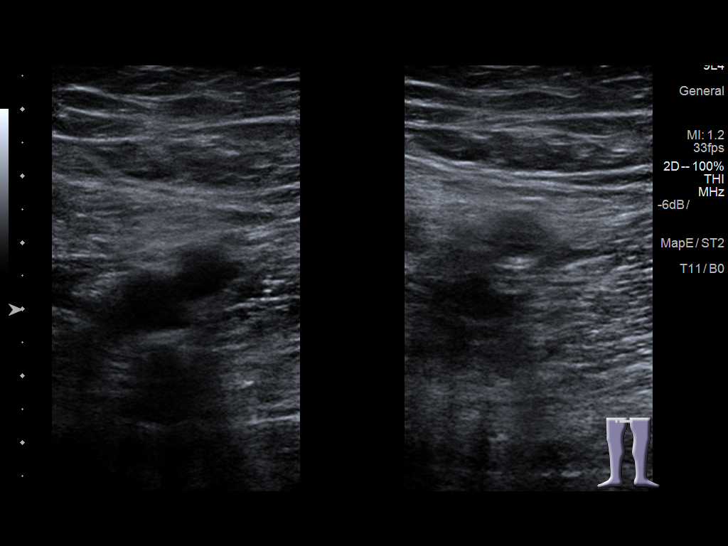
[im 17/49]
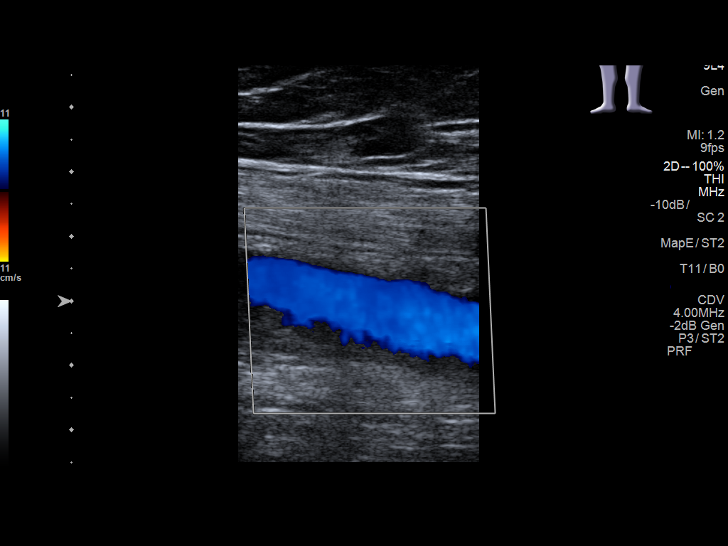
[im 21/49]
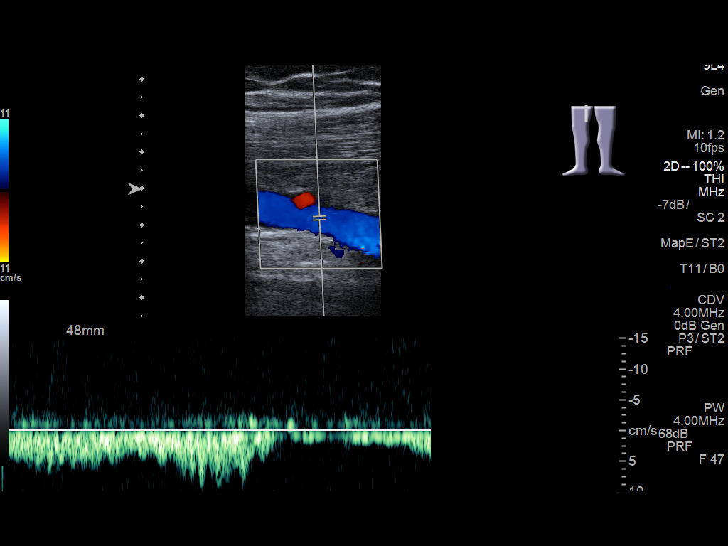
[im 26/49]
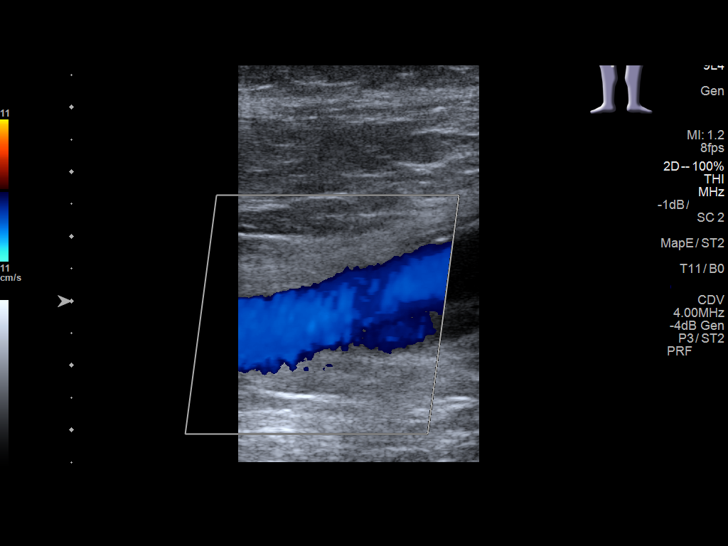
[im 28/49]
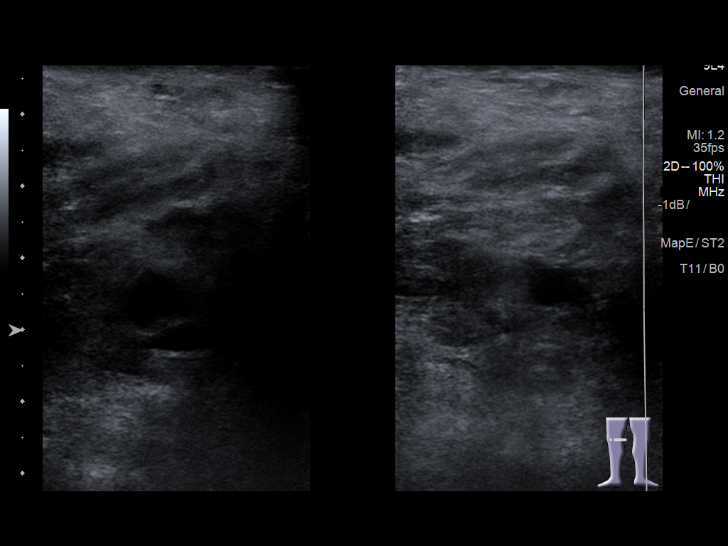
[im 32/49]
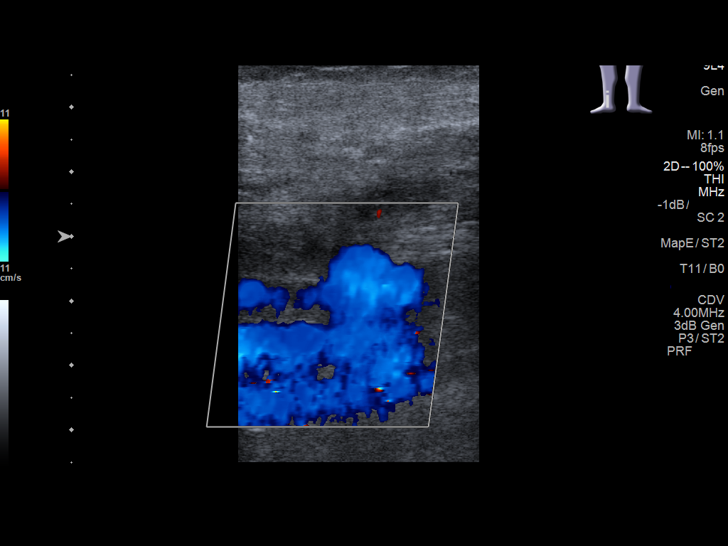
[im 36/49]
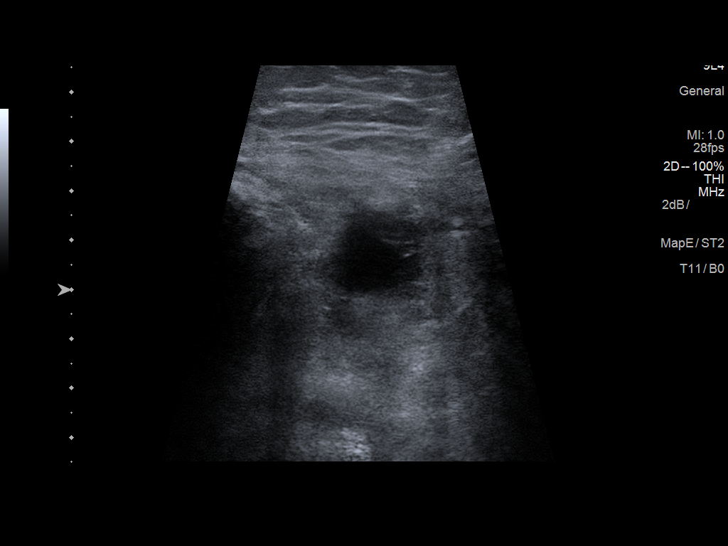
[im 40/49]
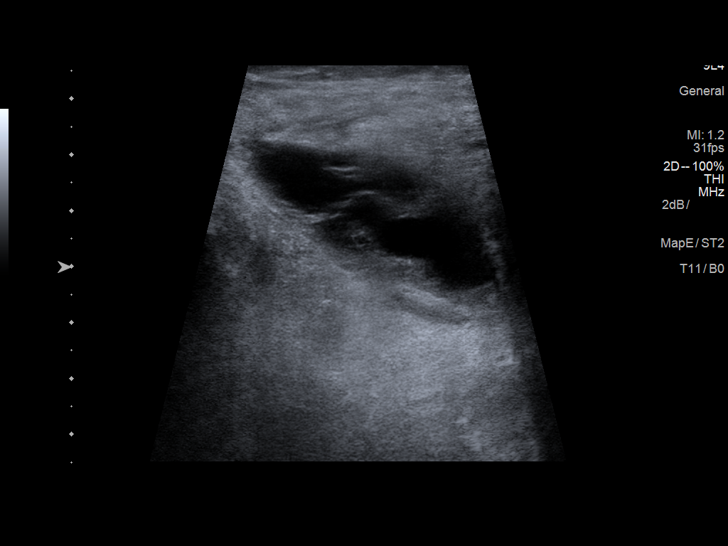
[im 44/49]
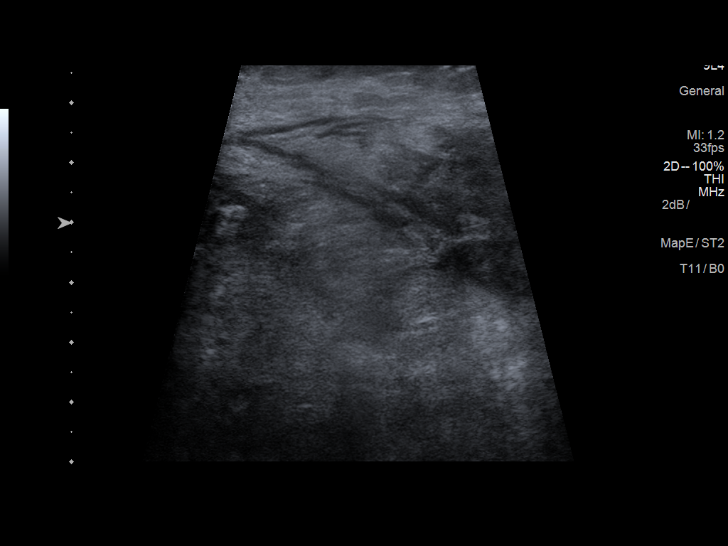
[im 49/49]
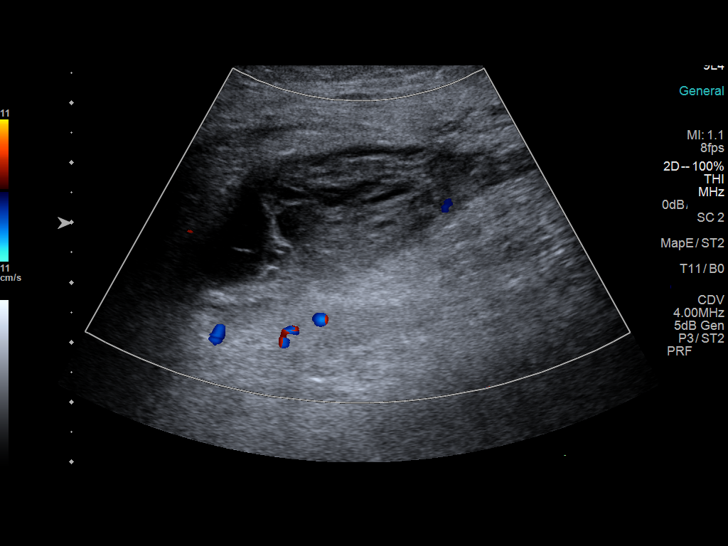

[13 of 24 positions shown; findings below may reference images not displayed]

FINDINGS: Contralateral Common Femoral Vein: Respiratory phasicity is normal
and symmetric with the symptomatic side. No evidence of thrombus.
Normal compressibility.

Common Femoral Vein: No evidence of thrombus. Normal
compressibility, respiratory phasicity and response to augmentation.

Saphenofemoral Junction: No evidence of thrombus. Normal
compressibility and flow on color Doppler imaging.

Profunda Femoral Vein: No evidence of thrombus. Normal
compressibility and flow on color Doppler imaging.

Femoral Vein: No evidence of thrombus. Normal compressibility,
respiratory phasicity and response to augmentation.

Popliteal Vein: No evidence of thrombus. Normal compressibility,
respiratory phasicity and response to augmentation.

Calf Veins: No evidence of thrombus. Normal compressibility and flow
on color Doppler imaging.

Superficial Great Saphenous Vein: No evidence of thrombus. Normal
compressibility.

Venous Reflux:  None.

Other Findings: There is an approximately 10.0 x 5.0 x 2.8 cm
complex serpiginous fluid collection which begins in the popliteal
fossa extends to involve the superior aspect of the calf.
IMPRESSION: 1. No evidence of DVT within right lower extremity.
2. Suspected approximately 10.2 cm complex fluid collection
involving the posterior aspect of the knee extending to involve the
superior aspect of the calf which in absence of trauma or infectious
symptoms is favored to represent a leaking Baker's cyst. Clinical
correlation is advised. Further evaluation with MRI could performed
as indicated.
